# Patient Record
Sex: Male | Born: 2000
Health system: Southern US, Community
[De-identification: ages and names within clinical notes are randomized; demographics above are authoritative.]

## PROBLEM LIST (undated history)

## (undated) DIAGNOSIS — L309 Dermatitis, unspecified: Secondary | ICD-10-CM

## (undated) DIAGNOSIS — F909 Attention-deficit hyperactivity disorder, unspecified type: Secondary | ICD-10-CM

## (undated) HISTORY — PX: GROWTH PLATE SURGERY: SHX657

## (undated) HISTORY — PX: CIRCUMCISION: SUR203

## (undated) HISTORY — PX: KNEE SURGERY: SHX244

---
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
## 2023-03-23 ENCOUNTER — Ambulatory Visit (HOSPITAL_COMMUNITY)
Admission: EM | Admit: 2023-03-23 | Discharge: 2023-03-23 | Disposition: A | Discharge status: Home / Self Care | Payer: Managed Care, Other (non HMO) | Attending: Emergency Medicine | Admitting: Emergency Medicine

## 2023-03-23 ENCOUNTER — Encounter (HOSPITAL_COMMUNITY): Payer: Self-pay | Admitting: Emergency Medicine

## 2023-03-23 DIAGNOSIS — R0981 Nasal congestion: Secondary | ICD-10-CM

## 2023-03-23 DIAGNOSIS — H66001 Acute suppurative otitis media without spontaneous rupture of ear drum, right ear: Secondary | ICD-10-CM | POA: Diagnosis not present

## 2023-03-23 MED ORDER — FLUTICASONE PROPIONATE 50 MCG/ACT NA SUSP: 1.0000 | Freq: Every day | NASAL | 2 refills | Status: AC

## 2023-03-23 MED ORDER — GUAIFENESIN ER 600 MG PO TB12: 600.0000 mg | ORAL_TABLET | Freq: Two times a day (BID) | ORAL | 0 refills | Status: AC

## 2023-03-23 MED ORDER — AMOXICILLIN-POT CLAVULANATE 875-125 MG PO TABS: 1.0000 | ORAL_TABLET | Freq: Two times a day (BID) | ORAL | 0 refills | Status: AC

## 2023-03-23 NOTE — ED Triage Notes (Signed)
Pt c/o bilateral ear pain since  yesterday.  States he has busted eardrums before due to sinus pressure.

## 2023-03-23 NOTE — ED Provider Notes (Signed)
[facility]    CSN: [id number] Arrival date & time: 03/23/23  1850      History   Chief Complaint Chief Complaint  Patient presents with   Otalgia    HPI Julian Schmidt is a 22 y.o. male.   Patient presents to clinic for bilateral ear pain. Had some yellow fluid draining from both ears. He was sick on Friday with congestion, fever and feeling unwell. Reports he got better yesterday.   He is concerned because he had his tympanic membranes rupture in the past.   Congestion continues.  He is taking Vicks cold and flu.  Has not had any Tylenol or ibuprofen today.    The history is provided by medical records and the patient.  Otalgia   Past Medical History:  Diagnosis Date   ADHD (attention deficit hyperactivity disorder)    Eczema     Patient Active Problem List   Diagnosis Date Noted   Tension headache 11/14/2014   Migraine without aura and without status migrainosus, not intractable 11/14/2014    Past Surgical History:  Procedure Laterality Date   CIRCUMCISION     GROWTH PLATE SURGERY Right    KNEE SURGERY         Home Medications    Prior to Admission medications   Medication Sig Start Date End Date Taking? Authorizing Provider  amoxicillin-clavulanate (AUGMENTIN) 875-125 MG tablet Take 1 tablet by mouth every 12 (twelve) hours for 7 days. 03/23/23 03/30/23 Yes Rinaldo Ratel, [place] N, FNP  fluticasone [facility]) 50 MCG/ACT nasal spray Place 1 spray into both nostrils daily. 03/23/23  Yes Rinaldo Ratel, [place] N, FNP  guaiFENesin (MUCINEX) 600 MG 12 hr tablet Take 1 tablet (600 mg total) by mouth 2 (two) times daily for 7 days. 03/23/23 03/30/23 Yes Rinaldo Ratel, [place] N, FNP  ibuprofen (ADVIL,MOTRIN) 200 MG tablet Take 200 mg by mouth every 6 (six) hours as needed.    [provider]  Magnesium Oxide 500 MG TABS Take by mouth.    [provider]  methylphenidate 18 MG PO CR tablet Take 18 mg by mouth daily.    [provider]   mometasone (NASONEX) 50 MCG/ACT nasal spray Place 2 sprays into the nose daily. 05/24/17   Cathie Hoops, Amy V, PA-C  propranolol (INDERAL) 20 MG tablet Take 1 tablet (20 mg total) by mouth 2 (two) times daily. ( start with 10 mg twice a day for the first week) Patient not taking: No sig reported 01/15/15   Keturah Shavers, MD  riboflavin (VITAMIN B-2) 100 MG TABS tablet Take 100 mg by mouth daily.    [provider]    Family History Family History  Problem Relation Age of Onset   Migraines Mother    Depression Mother    Anxiety disorder Mother    Migraines Brother        Oldest brother has migraines    ADD / ADHD Brother        Oldest brother has ADHD   Migraines Maternal Grandmother    Depression Maternal Grandmother    Anxiety disorder Maternal Grandmother    Heart Problems Maternal Grandfather    Lung disease Maternal Grandfather    Heart Problems Paternal Grandmother    Prostate cancer Paternal Grandfather     Social History Social History   Tobacco Use   Smoking status: Never   Smokeless tobacco: Never  Substance Use Topics   Alcohol use: No   Drug use: No     Allergies  Other   Review of Systems Review of Systems  Per HPI   Physical Exam Triage Vital Signs ED Triage Vitals [03/23/23 1923]  Encounter Vitals Group     BP 139/88     Systolic BP Percentile      Diastolic BP Percentile      Pulse Rate 83     Resp 17     Temp 98.3 F (36.8 C)     Temp Source Oral     SpO2 95 %     Weight      Height      Head Circumference      Peak Flow      Pain Score      Pain Loc      Pain Education      Exclude from Growth Chart    No data found.  Updated Vital Signs BP 139/88 (BP Location: Left Arm)   Pulse 83   Temp 98.3 F (36.8 C) (Oral)   Resp 17   SpO2 95%   Visual Acuity Right Eye Distance:   Left Eye Distance:   Bilateral Distance:    Right Eye Near:   Left Eye Near:    Bilateral Near:     Physical Exam Vitals and nursing note  reviewed.  Constitutional:      Appearance: Normal appearance.  HENT:     Head: Normocephalic and atraumatic.     Right Ear: A middle ear effusion is present. Tympanic membrane is erythematous and bulging.     Left Ear: A middle ear effusion is present.     Nose: Congestion and rhinorrhea present.     Mouth/Throat:     Mouth: Mucous membranes are moist.  Eyes:     Conjunctiva/sclera: Conjunctivae normal.  Cardiovascular:     Rate and Rhythm: Normal rate.  Pulmonary:     Effort: Pulmonary effort is normal. No respiratory distress.  Musculoskeletal:        General: Normal range of motion.  Skin:    General: Skin is warm and dry.  Neurological:     General: No focal deficit present.     Mental Status: He is alert and oriented to person, place, and time.  Psychiatric:        Mood and Affect: Mood normal.        Behavior: Behavior normal. Behavior is cooperative.      UC Treatments / Results  Labs (all labs ordered are listed, but only abnormal results are displayed) Labs Reviewed - No data to display  EKG   Radiology No results found.  Procedures Procedures (including critical care time)  Medications Ordered in UC Medications - No data to display  Initial Impression / Assessment and Plan / UC Course  I have reviewed the triage vital signs and the nursing notes.  Pertinent labs & imaging results that were available during my care of the patient were reviewed by me and considered in my medical decision making (see chart for details).  Vitals and triage reviewed, patient is hemodynamically stable.  Right tympanic membrane is erythematous and bulging.  Will cover with Augmentin for acute otitis media.  Bilateral tympanic membranes are intact, external auditory canal without erythema or drainage.  Low concern for otitis externa.  Symptomatic management for congestion to help with otalgia discussed.  Plan of care, follow-up care return precautions given, no questions at this  time.  Work note provided.     Final Clinical Impressions(s) / UC Diagnoses  Final diagnoses:  Acute suppurative otitis media of right ear without spontaneous rupture of tympanic membrane, recurrence not specified  Nasal congestion     Discharge Instructions      You have an ear infection of your right ear.  Take the antibiotics as prescribed and with food until finished.  Use the Mucinex and Flonase to help with any nasal congestion to help avoid further pressure on your tympanic membranes.  Both tympanic membranes were intact and not ruptured today in clinic.  You can alternate between 800 mg of ibuprofen and 500 mg of Tylenol every 4-6 hours for pain or discomfort.  Return to clinic for any new or urgent symptoms.     ED Prescriptions     Medication Sig Dispense Auth. Provider   amoxicillin-clavulanate (AUGMENTIN) 875-125 MG tablet Take 1 tablet by mouth every 12 (twelve) hours for 7 days. 14 tablet Rinaldo Ratel, [place] N, [place]   guaiFENesin (MUCINEX) 600 MG 12 hr tablet Take 1 tablet (600 mg total) by mouth 2 (two) times daily for 7 days. 14 tablet Rinaldo Ratel, [place] N, FNP   fluticasone [facility]) 50 MCG/ACT nasal spray Place 1 spray into both nostrils daily. 9.9 mL Stanlee Roehrig, [place] N, FNP      PDMP not reviewed this encounter.   Micky Sheller, [place] N, [place] 03/23/23 [phone]

## 2023-03-23 NOTE — Discharge Instructions (Addendum)
You have an ear infection of your right ear.  Take the antibiotics as prescribed and with food until finished.  Use the Mucinex and Flonase to help with any nasal congestion to help avoid further pressure on your tympanic membranes.  Both tympanic membranes were intact and not ruptured today in clinic.  You can alternate between 800 mg of ibuprofen and 500 mg of Tylenol every 4-6 hours for pain or discomfort.  Return to clinic for any new or urgent symptoms.
